# Patient Record
Sex: Female | Born: 1940 | Race: Black or African American | Hispanic: No | Marital: Single | Smoking: Former smoker
Health system: Southern US, Community
[De-identification: ages and names within clinical notes are randomized; demographics above are authoritative.]

## PROBLEM LIST (undated history)

## (undated) DIAGNOSIS — M549 Dorsalgia, unspecified: Secondary | ICD-10-CM

---

## 2010-07-07 ENCOUNTER — Other Ambulatory Visit: Payer: Self-pay | Admitting: Orthopedic Surgery

## 2010-07-07 DIAGNOSIS — M545 Low back pain, unspecified: Secondary | ICD-10-CM

## 2010-07-08 ENCOUNTER — Ambulatory Visit
Admission: RE | Admit: 2010-07-08 | Discharge: 2010-07-08 | Disposition: A | Discharge status: Home / Self Care | Payer: Medicare Other | Source: Ambulatory Visit | Attending: Orthopedic Surgery | Admitting: Orthopedic Surgery

## 2010-07-08 ENCOUNTER — Ambulatory Visit
Admission: RE | Admit: 2010-07-08 | Discharge: 2010-07-08 | Disposition: A | Discharge status: Home / Self Care | Payer: PRIVATE HEALTH INSURANCE | Source: Ambulatory Visit | Attending: Orthopedic Surgery | Admitting: Orthopedic Surgery

## 2010-07-08 DIAGNOSIS — M545 Low back pain, unspecified: Secondary | ICD-10-CM

## 2010-07-19 ENCOUNTER — Other Ambulatory Visit (HOSPITAL_COMMUNITY): Payer: Self-pay | Admitting: Orthopedic Surgery

## 2010-07-19 DIAGNOSIS — M79604 Pain in right leg: Secondary | ICD-10-CM

## 2010-07-21 ENCOUNTER — Ambulatory Visit (HOSPITAL_COMMUNITY)
Admission: RE | Admit: 2010-07-21 | Discharge: 2010-07-21 | Disposition: A | Discharge status: Home / Self Care | Payer: Medicare Other | Source: Ambulatory Visit | Attending: Orthopedic Surgery | Admitting: Orthopedic Surgery

## 2010-07-21 ENCOUNTER — Other Ambulatory Visit (HOSPITAL_COMMUNITY): Payer: Self-pay | Admitting: Orthopedic Surgery

## 2010-07-21 DIAGNOSIS — IMO0002 Reserved for concepts with insufficient information to code with codable children: Secondary | ICD-10-CM

## 2010-07-21 DIAGNOSIS — M795 Residual foreign body in soft tissue: Secondary | ICD-10-CM | POA: Insufficient documentation

## 2010-07-21 DIAGNOSIS — Z1810 Retained metal fragments, unspecified: Secondary | ICD-10-CM | POA: Insufficient documentation

## 2010-07-21 DIAGNOSIS — M79604 Pain in right leg: Secondary | ICD-10-CM

## 2010-10-17 ENCOUNTER — Emergency Department (HOSPITAL_COMMUNITY)
Admission: EM | Admit: 2010-10-17 | Discharge: 2010-10-17 | Disposition: A | Discharge status: Home / Self Care | Payer: Medicare Other | Attending: Emergency Medicine | Admitting: Emergency Medicine

## 2010-10-17 ENCOUNTER — Emergency Department (HOSPITAL_COMMUNITY): Payer: Medicare Other

## 2010-10-17 DIAGNOSIS — F3289 Other specified depressive episodes: Secondary | ICD-10-CM | POA: Insufficient documentation

## 2010-10-17 DIAGNOSIS — M545 Low back pain, unspecified: Secondary | ICD-10-CM | POA: Insufficient documentation

## 2010-10-17 DIAGNOSIS — R5381 Other malaise: Secondary | ICD-10-CM | POA: Insufficient documentation

## 2010-10-17 DIAGNOSIS — I10 Essential (primary) hypertension: Secondary | ICD-10-CM | POA: Insufficient documentation

## 2010-10-17 DIAGNOSIS — Z79899 Other long term (current) drug therapy: Secondary | ICD-10-CM | POA: Insufficient documentation

## 2010-10-17 DIAGNOSIS — R51 Headache: Secondary | ICD-10-CM | POA: Insufficient documentation

## 2010-10-17 DIAGNOSIS — R5383 Other fatigue: Secondary | ICD-10-CM | POA: Insufficient documentation

## 2010-10-17 DIAGNOSIS — G8929 Other chronic pain: Secondary | ICD-10-CM | POA: Insufficient documentation

## 2010-10-17 DIAGNOSIS — F329 Major depressive disorder, single episode, unspecified: Secondary | ICD-10-CM | POA: Insufficient documentation

## 2010-10-17 LAB — URINALYSIS, ROUTINE W REFLEX MICROSCOPIC
Glucose, UA: NEGATIVE mg/dL
Hgb urine dipstick: NEGATIVE
Specific Gravity, Urine: 1.016 (ref 1.005–1.030)
pH: 5.5 (ref 5.0–8.0)

## 2010-10-17 LAB — CBC
HCT: 43.1 % (ref 36.0–46.0)
Hemoglobin: 14.9 g/dL (ref 12.0–15.0)
MCHC: 34.6 g/dL (ref 30.0–36.0)
RBC: 5.31 MIL/uL — ABNORMAL HIGH (ref 3.87–5.11)
WBC: 10 10*3/uL (ref 4.0–10.5)

## 2010-10-17 LAB — COMPREHENSIVE METABOLIC PANEL
ALT: 11 U/L (ref 0–35)
Albumin: 3.4 g/dL — ABNORMAL LOW (ref 3.5–5.2)
Alkaline Phosphatase: 75 U/L (ref 39–117)
Calcium: 9.4 mg/dL (ref 8.4–10.5)
GFR calc Af Amer: 82 mL/min — ABNORMAL LOW (ref >90)
Glucose, Bld: 92 mg/dL (ref 70–99)
Potassium: 3.4 mEq/L — ABNORMAL LOW (ref 3.5–5.1)
Sodium: 137 mEq/L (ref 135–145)
Total Protein: 6.6 g/dL (ref 6.0–8.3)

## 2010-10-17 LAB — SEDIMENTATION RATE: Sed Rate: 9 mm/hr (ref 0–22)

## 2010-10-17 LAB — URINE MICROSCOPIC-ADD ON

## 2010-10-18 LAB — URINE CULTURE
Colony Count: 80000
Culture  Setup Time: 201210080222

## 2011-07-07 ENCOUNTER — Emergency Department (HOSPITAL_COMMUNITY)
Admission: EM | Admit: 2011-07-07 | Discharge: 2011-07-07 | Disposition: A | Discharge status: Home / Self Care | Payer: Medicare Other | Attending: Emergency Medicine | Admitting: Emergency Medicine

## 2011-07-07 ENCOUNTER — Encounter (HOSPITAL_COMMUNITY): Payer: Self-pay

## 2011-07-07 DIAGNOSIS — Z91041 Radiographic dye allergy status: Secondary | ICD-10-CM | POA: Insufficient documentation

## 2011-07-07 DIAGNOSIS — M545 Low back pain, unspecified: Secondary | ICD-10-CM | POA: Insufficient documentation

## 2011-07-07 DIAGNOSIS — M549 Dorsalgia, unspecified: Secondary | ICD-10-CM

## 2011-07-07 DIAGNOSIS — Z79899 Other long term (current) drug therapy: Secondary | ICD-10-CM | POA: Insufficient documentation

## 2011-07-07 MED ORDER — OXYCODONE HCL 30 MG PO TB12: 30.0000 mg | ORAL_TABLET | Freq: Two times a day (BID) | ORAL | Status: DC

## 2011-07-07 NOTE — ED Provider Notes (Signed)
History   This chart was scribed for Benny Lennert, MD by Sofie Rower. The patient was seen in room TR11C/TR11C and the patient's care was started at 3:04 PM     CSN: [id number]  Arrival date & time 07/07/11  1323   None     Chief Complaint  Patient presents with  . Back Pain    (Consider location/radiation/quality/duration/timing/severity/associated sxs/prior treatment) Patient is a 71 y.o. female presenting with back pain. The history is provided by the patient. No language interpreter was used.  Back Pain  This is a recurrent problem. The problem occurs constantly. The problem has not changed since onset.The pain is associated with no known injury. The pain does not radiate. The pain is moderate. The pain is the same all the time. Pertinent negatives include no chest pain, no headaches and no abdominal pain. Treatments tried: Taking oxycodone. The treatment provided moderate relief.    Pt informs the EDP that she had an appointment to go to the hospital on 06/21/11. Pt has a hx of back surgery (2010). Pt has a hx of visiting with Dr. Jordan Likes. Pt has a hx of taking oxycodone for back pain (30 mg X 4 per day). The last time the pt took oxycodone was this morning. The pt informs the EDP that the hospital told her to follow up with a PCP.  Pt does not have a PCP.    History  Substance Use Topics  . Smoking status: Not on file  . Smokeless tobacco: Not on file  . Alcohol Use: Not on file    OB History    Grav Para Term Preterm Abortions TAB SAB Ect Mult Living                  Review of Systems  Constitutional: Negative for fatigue.  HENT: Negative for congestion, sinus pressure and ear discharge.   Eyes: Negative for discharge.  Respiratory: Negative for cough.   Cardiovascular: Negative for chest pain.  Gastrointestinal: Negative for abdominal pain and diarrhea.  Genitourinary: Negative for frequency and hematuria.  Musculoskeletal: Positive for back pain.  Skin:  Negative for rash.  Neurological: Negative for seizures and headaches.  Hematological: Negative.   Psychiatric/Behavioral: Negative for hallucinations.    Allergies  Iodinated diagnostic agents  Home Medications   Current Outpatient Rx  Name Route Sig Dispense Refill  . VITAMIN D 2000 UNITS PO TABS Oral Take 2,000 Units by mouth daily.    Marland Kitchen HYDROCHLOROTHIAZIDE 12.5 MG PO CAPS Oral Take 12.5 mg by mouth daily.    . OXYCODONE HCL ER 30 MG PO TB12 Oral Take 30 mg by mouth every 12 (twelve) hours.    Marland Kitchen ROSUVASTATIN CALCIUM 5 MG PO TABS Oral Take 5 mg by mouth at bedtime.      BP 132/77  Pulse 86  Temp 98 F (36.7 C) (Oral)  Resp 16  SpO2 99%  Physical Exam  Nursing note and vitals reviewed. Constitutional: She is oriented to person, place, and time. She appears well-developed.  HENT:  Head: Normocephalic.  Eyes: Conjunctivae are normal.  Neck: No tracheal deviation present.  Cardiovascular:  No murmur heard. Musculoskeletal: Normal range of motion. She exhibits tenderness (Tenderness located at the Lumbar spine with scar over the lumbar spine. ).  Neurological: She is oriented to person, place, and time.  Skin: Skin is warm.  Psychiatric: She has a normal mood and affect.    ED Course  Procedures (including critical care time)  DIAGNOSTIC  STUDIES: Oxygen Saturation is 99% on room air, normal by my interpretation.    COORDINATION OF CARE:  3:09PM- EDP at bedside discusses treatment plan concerning pain management and follow up with PCP, Nurse Practitioner.   Labs Reviewed - No data to display No results found.   No diagnosis found.    MDM       The chart was scribed for me under my direct supervision.  I personally performed the history, physical, and medical decision making and all procedures in the evaluation of this patient.Benny Lennert, MD 07/07/11 [phone]

## 2011-07-07 NOTE — Discharge Instructions (Signed)
Follow up with your primary provider and [facility]

## 2011-07-07 NOTE — ED Notes (Signed)
sts dr Jordan Likes has a hold on the pharmacy to not fill any medications.

## 2011-07-07 NOTE — ED Notes (Signed)
Pt had surgery in 2010 and has been on oxycodone for back pain, pt here from [place] and is assigned a pain management doctor who does not give pain pills had a tens unit placed and sts that he said there is nothing else he could do for her.

## 2011-08-18 ENCOUNTER — Emergency Department (HOSPITAL_COMMUNITY)
Admission: EM | Admit: 2011-08-18 | Discharge: 2011-08-18 | Disposition: A | Discharge status: Home / Self Care | Payer: Medicare Other | Attending: Emergency Medicine | Admitting: Emergency Medicine

## 2011-08-18 DIAGNOSIS — G8929 Other chronic pain: Secondary | ICD-10-CM | POA: Insufficient documentation

## 2011-08-18 DIAGNOSIS — M549 Dorsalgia, unspecified: Secondary | ICD-10-CM | POA: Insufficient documentation

## 2011-08-18 MED ORDER — OXYCODONE HCL 30 MG PO TB12: 30.0000 mg | ORAL_TABLET | Freq: Two times a day (BID) | ORAL | Status: AC

## 2011-08-18 NOTE — ED Notes (Signed)
Pt reports hx of chronic back pain, reports she took her last "pain pill" today at 1300 and does not have a prescription for any more. Pt sent here from [facility] for a refill of her prescription pain medication. Pt is trying to get into pain management clinic.

## 2011-08-18 NOTE — ED Provider Notes (Signed)
History  Scribed for Angela Kras, MD, the patient was seen in room TR09C/TR09C. This chart was scribed by Candelaria Stagers. The patient's care started at 3:43 PM   CSN: [id number]  Arrival date & time 08/18/11  1451   First MD Initiated Contact with Patient 08/18/11 1541      Chief Complaint  Patient presents with  . Back Pain     The history is provided by the patient.   Angela Bautista is a 71 y.o. female who presents to the Emergency Department complaining of chronic back pain and need for a refill of pain medications.  Pt reports that she was sent here from Urgent Care.  She states that she ran out of pain medication earlier today.   Patient was going to a pain management clinic. She was discharged from that practice. Patient states her primary doctor will not refill her pain medications. She is in the process of trying to find another pain management clinic.  She has been living in [place] for at least a year.  No past medical history on file.  No past surgical history on file.  No family history on file.  History  Substance Use Topics  . Smoking status: Not on file  . Smokeless tobacco: Not on file  . Alcohol Use: Not on file    OB History    Grav Para Term Preterm Abortions TAB SAB Ect Mult Living                  Review of Systems  Constitutional: Negative for fever.       10 Systems reviewed and are negative for acute change except as noted in the HPI.  HENT: Negative for congestion.   Eyes: Negative for discharge and redness.  Respiratory: Negative for cough and shortness of breath.   Cardiovascular: Negative for chest pain.  Gastrointestinal: Negative for vomiting and abdominal pain.  Musculoskeletal: Positive for back pain.  Skin: Negative for rash.  Neurological: Negative for syncope, numbness and headaches.  Psychiatric/Behavioral:       No behavior change.    Allergies  Iodinated diagnostic agents  Home Medications   Current Outpatient Rx    Name Route Sig Dispense Refill  . VITAMIN D 2000 UNITS PO TABS Oral Take 2,000 Units by mouth daily.    Marland Kitchen HYDROCHLOROTHIAZIDE 12.5 MG PO CAPS Oral Take 12.5 mg by mouth daily.    . OXYCODONE HCL ER 30 MG PO TB12 Oral Take 30 mg by mouth every 12 (twelve) hours.    Marland Kitchen ROSUVASTATIN CALCIUM 5 MG PO TABS Oral Take 5 mg by mouth at bedtime.    Marland Kitchen ZOLPIDEM TARTRATE ER 12.5 MG PO TBCR Oral Take 12.5 mg by mouth at bedtime as needed. For insomnia.      BP 127/75  Pulse 83  Temp 98.5 F (36.9 C) (Oral)  Resp 18  SpO2 96%  Physical Exam  Nursing note and vitals reviewed. Constitutional: She appears well-developed and well-nourished. No distress.  HENT:  Head: Normocephalic and atraumatic.  Right Ear: External ear normal.  Left Ear: External ear normal.  Eyes: Conjunctivae are normal. Right eye exhibits no discharge. Left eye exhibits no discharge. No scleral icterus.  Neck: Neck supple. No tracheal deviation present.  Cardiovascular: Normal rate.   Pulmonary/Chest: Effort normal. No stridor. No respiratory distress.  Musculoskeletal: She exhibits no edema.  Neurological: She is alert. Cranial nerve deficit: no gross deficits.  Skin: Skin is warm and dry. No  rash noted.  Psychiatric: She has a normal mood and affect.    ED Course  Procedures   DIAGNOSTIC STUDIES: Oxygen Saturation is 96% on room air, normal by my interpretation.    COORDINATION OF CARE:     Labs Reviewed - No data to display No results found.   No diagnosis found.    MDM  I explained to the patient that refills of pain medications or been stung by a primary care doctor pain management Dr. Reesa Chew do not routinely do that in the emergency room. I will provide her prescription for a few days worth to help her in the meantime. I personally performed the services described in this documentation, which was scribed in my presence.  The recorded information has been reviewed and considered.        Angela Kras,  MD 08/18/11 [phone]

## 2011-08-22 ENCOUNTER — Emergency Department (HOSPITAL_COMMUNITY)
Admission: EM | Admit: 2011-08-22 | Discharge: 2011-08-22 | Disposition: A | Discharge status: Home / Self Care | Payer: Medicare Other | Attending: Emergency Medicine | Admitting: Emergency Medicine

## 2011-08-22 ENCOUNTER — Encounter (HOSPITAL_COMMUNITY): Payer: Self-pay

## 2011-08-22 DIAGNOSIS — M549 Dorsalgia, unspecified: Secondary | ICD-10-CM | POA: Insufficient documentation

## 2011-08-22 DIAGNOSIS — G8929 Other chronic pain: Secondary | ICD-10-CM | POA: Insufficient documentation

## 2011-08-22 HISTORY — DX: Dorsalgia, unspecified: M54.9

## 2011-08-22 MED ORDER — OXYCODONE HCL 30 MG PO TB12: 30.0000 mg | ORAL_TABLET | Freq: Two times a day (BID) | ORAL | Status: DC

## 2011-08-22 NOTE — ED Notes (Signed)
Was here Thursday, hx of chronic pain since 2010 surgery, sts needs pain meds, but we will not give it to her. sts she just needs some until she leaves for [place].

## 2011-08-23 NOTE — ED Provider Notes (Signed)
History     CSN: [id number]  Arrival date & time 08/22/11  1330   First MD Initiated Contact with Patient 08/22/11 1721      No chief complaint on file.   (Consider location/radiation/quality/duration/timing/severity/associated sxs/prior treatment) HPI Angela Bautista is a 71 y.o. female who presents to the Emergency Department complaining of chronic back pain and need for a refill of pain medications. Pt reports that she was sent here from Urgent Care. She states that she ran out of pain medication earlier today. Patient was going to a pain management clinic. She was discharged from that practice. Patient states her primary doctor will not refill her pain medications. She is in the process of trying to find another pain management clinic. She has been living in [place] for at least a year. Was seen Thursday here, received rx for oxycodone 30mg , #8, which she reports she's been taking twice a day. She reports that she is planning to go back to [place] where she is from later this week and has an appt with her old PCP there next week. She is requesting enough pain medication to get to that appt.  Past Medical History  Diagnosis Date  . Back pain     No past surgical history on file.  No family history on file.  History  Substance Use Topics  . Smoking status: Never Smoker   . Smokeless tobacco: Not on file  . Alcohol Use: No    OB History    Grav Para Term Preterm Abortions TAB SAB Ect Mult Living                  Review of Systems  Constitutional: Negative for fever and chills.  Gastrointestinal: Negative for abdominal pain.  Musculoskeletal: Positive for back pain and gait problem. Negative for myalgias and joint swelling.  Skin: Negative for color change and rash.  Neurological: Negative for weakness and numbness.    Allergies  Iodinated diagnostic agents  Home Medications   Current Outpatient Rx  Name Route Sig Dispense Refill  . VITAMIN D 2000 UNITS PO  TABS Oral Take 2,000 Units by mouth daily.    Marland Kitchen HYDROCHLOROTHIAZIDE 12.5 MG PO CAPS Oral Take 12.5 mg by mouth daily.    . OXYCODONE HCL ER 30 MG PO TB12 Oral Take 1 tablet (30 mg total) by mouth every 12 (twelve) hours. 8 tablet 0  . ROSUVASTATIN CALCIUM 5 MG PO TABS Oral Take 5 mg by mouth at bedtime.    Marland Kitchen ZOLPIDEM TARTRATE ER 12.5 MG PO TBCR Oral Take 12.5 mg by mouth at bedtime as needed. For insomnia.    . OXYCODONE HCL ER 30 MG PO TB12 Oral Take 1 tablet (30 mg total) by mouth every 12 (twelve) hours. 14 tablet 0    BP 136/83  Pulse 71  Temp 98 F (36.7 C) (Oral)  Resp 18  SpO2 96%  Physical Exam  Nursing note and vitals reviewed. Constitutional: She appears well-developed and well-nourished. No distress.  HENT:  Head: Normocephalic and atraumatic.  Neck: Normal range of motion.  Cardiovascular: Normal rate.   Pulmonary/Chest: Effort normal.  Musculoskeletal: Normal range of motion.  Neurological: She is alert.       No gross deficits  Skin: Skin is warm and dry. She is not diaphoretic.  Psychiatric: She has a normal mood and affect.    ED Course  Procedures (including critical care time)  Labs Reviewed - No data to display No results  found.   1. Chronic back pain       MDM  Pt presents requesting pain med refill. States she's planning to move back to [place] later this week. Again explained that the ED is not an appropriate place for pain med refills. Small rx given. Stated that she is NOT to receive further pain meds here. She verbalized understanding of this.       Grant Fontana, PA-C 08/23/11 1752

## 2011-08-25 NOTE — ED Provider Notes (Signed)
Medical screening examination/treatment/procedure(s) were performed by non-physician practitioner and as supervising physician I was immediately available for consultation/collaboration.  Naira Standiford, MD 08/25/11 0738 

## 2012-12-19 DIAGNOSIS — G8929 Other chronic pain: Secondary | ICD-10-CM | POA: Insufficient documentation

## 2012-12-19 DIAGNOSIS — I10 Essential (primary) hypertension: Secondary | ICD-10-CM | POA: Insufficient documentation

## 2012-12-19 DIAGNOSIS — M1990 Unspecified osteoarthritis, unspecified site: Secondary | ICD-10-CM | POA: Insufficient documentation

## 2014-04-02 DIAGNOSIS — G8929 Other chronic pain: Secondary | ICD-10-CM | POA: Insufficient documentation

## 2015-12-14 DIAGNOSIS — Z981 Arthrodesis status: Secondary | ICD-10-CM | POA: Insufficient documentation

## 2017-05-16 DIAGNOSIS — E782 Mixed hyperlipidemia: Secondary | ICD-10-CM | POA: Insufficient documentation

## 2017-05-31 DIAGNOSIS — N183 Chronic kidney disease, stage 3 unspecified: Secondary | ICD-10-CM | POA: Insufficient documentation

## 2017-06-03 DIAGNOSIS — Z789 Other specified health status: Secondary | ICD-10-CM | POA: Insufficient documentation

## 2017-09-06 DIAGNOSIS — R7303 Prediabetes: Secondary | ICD-10-CM | POA: Insufficient documentation

## 2019-03-21 DIAGNOSIS — I739 Peripheral vascular disease, unspecified: Secondary | ICD-10-CM | POA: Insufficient documentation

## 2020-11-30 ENCOUNTER — Ambulatory Visit
Admission: RE | Admit: 2020-11-30 | Discharge: 2020-11-30 | Disposition: A | Discharge status: Home / Self Care | Payer: Medicare Other | Source: Ambulatory Visit | Attending: Family Medicine | Admitting: Family Medicine

## 2020-11-30 ENCOUNTER — Other Ambulatory Visit: Payer: Self-pay | Admitting: Family Medicine

## 2020-11-30 ENCOUNTER — Other Ambulatory Visit: Payer: Self-pay

## 2020-11-30 DIAGNOSIS — R52 Pain, unspecified: Secondary | ICD-10-CM

## 2020-12-02 ENCOUNTER — Other Ambulatory Visit: Payer: Self-pay | Admitting: Family Medicine

## 2020-12-02 DIAGNOSIS — Z780 Asymptomatic menopausal state: Secondary | ICD-10-CM

## 2021-04-09 ENCOUNTER — Ambulatory Visit
Admission: RE | Admit: 2021-04-09 | Discharge: 2021-04-09 | Disposition: A | Discharge status: Home / Self Care | Payer: Medicare Other | Source: Ambulatory Visit | Attending: Nurse Practitioner | Admitting: Nurse Practitioner

## 2021-04-09 ENCOUNTER — Other Ambulatory Visit: Payer: Self-pay | Admitting: Nurse Practitioner

## 2021-04-09 DIAGNOSIS — R109 Unspecified abdominal pain: Secondary | ICD-10-CM

## 2021-04-09 DIAGNOSIS — M25512 Pain in left shoulder: Secondary | ICD-10-CM

## 2021-04-15 ENCOUNTER — Other Ambulatory Visit (HOSPITAL_COMMUNITY): Payer: Self-pay | Admitting: Nurse Practitioner

## 2021-04-15 DIAGNOSIS — R109 Unspecified abdominal pain: Secondary | ICD-10-CM

## 2021-04-15 DIAGNOSIS — M25512 Pain in left shoulder: Secondary | ICD-10-CM

## 2021-04-20 ENCOUNTER — Other Ambulatory Visit: Payer: Self-pay | Admitting: Nurse Practitioner

## 2021-04-20 DIAGNOSIS — E2839 Other primary ovarian failure: Secondary | ICD-10-CM

## 2021-10-11 ENCOUNTER — Ambulatory Visit
Admission: RE | Admit: 2021-10-11 | Discharge: 2021-10-11 | Disposition: A | Discharge status: Home / Self Care | Payer: Medicare Other | Source: Ambulatory Visit | Attending: Nurse Practitioner | Admitting: Nurse Practitioner

## 2021-10-11 DIAGNOSIS — E2839 Other primary ovarian failure: Secondary | ICD-10-CM

## 2022-06-08 ENCOUNTER — Institutional Professional Consult (permissible substitution): Payer: Medicare Other | Admitting: Internal Medicine

## 2022-06-09 ENCOUNTER — Other Ambulatory Visit: Payer: Self-pay

## 2022-06-09 ENCOUNTER — Other Ambulatory Visit: Payer: Self-pay | Admitting: Nurse Practitioner

## 2022-06-09 DIAGNOSIS — S0095XA Superficial foreign body of unspecified part of head, initial encounter: Secondary | ICD-10-CM

## 2022-06-09 DIAGNOSIS — R413 Other amnesia: Secondary | ICD-10-CM

## 2022-07-19 ENCOUNTER — Inpatient Hospital Stay: Admission: RE | Admit: 2022-07-19 | Payer: Medicare Other | Source: Ambulatory Visit

## 2022-07-27 ENCOUNTER — Ambulatory Visit (INDEPENDENT_AMBULATORY_CARE_PROVIDER_SITE_OTHER): Payer: Medicare Other | Admitting: Pulmonary Disease

## 2022-07-27 ENCOUNTER — Encounter: Payer: Self-pay | Admitting: Pulmonary Disease

## 2022-07-27 VITALS — BP 126/78 | HR 83 | Ht 64.0 in | Wt 232.4 lb

## 2022-07-27 DIAGNOSIS — R0609 Other forms of dyspnea: Secondary | ICD-10-CM

## 2022-07-27 LAB — CBC WITH DIFFERENTIAL/PLATELET
Basophils Absolute: 0.1 10*3/uL (ref 0.0–0.1)
Basophils Relative: 0.9 % (ref 0.0–3.0)
Eosinophils Absolute: 0.1 10*3/uL (ref 0.0–0.7)
Eosinophils Relative: 1.3 % (ref 0.0–5.0)
HCT: 42.6 % (ref 36.0–46.0)
Hemoglobin: 13.5 g/dL (ref 12.0–15.0)
Lymphocytes Relative: 25.6 % (ref 12.0–46.0)
Lymphs Abs: 1.7 10*3/uL (ref 0.7–4.0)
MCHC: 31.7 g/dL (ref 30.0–36.0)
MCV: 85.9 fl (ref 78.0–100.0)
Monocytes Absolute: 0.4 10*3/uL (ref 0.1–1.0)
Monocytes Relative: 6.6 % (ref 3.0–12.0)
Neutro Abs: 4.4 10*3/uL (ref 1.4–7.7)
Neutrophils Relative %: 65.6 % (ref 43.0–77.0)
Platelets: 245 10*3/uL (ref 150.0–400.0)
RBC: 4.96 Mil/uL (ref 3.87–5.11)
RDW: 16.5 % — ABNORMAL HIGH (ref 11.5–15.5)
WBC: 6.7 10*3/uL (ref 4.0–10.5)

## 2022-07-27 MED ORDER — PANTOPRAZOLE SODIUM 20 MG PO TBEC: 20.0000 mg | DELAYED_RELEASE_TABLET | Freq: Two times a day (BID) | ORAL | 2 refills | Status: DC

## 2022-07-27 NOTE — Patient Instructions (Signed)
Will get some labs today including CBC with differential, IgE Start you on Protonix 20 mg twice daily Schedule PFTs and follow-up in 3 months

## 2022-07-27 NOTE — Progress Notes (Signed)
Melesa Badamo    [id number]    Aug 21, 1940  Primary Care Physician:Default, Provider, MD  Referring Physician: No referring provider defined for this encounter.  Chief complaint: Consult for dyspnea  HPI: 82 y.o. who  has a past medical history of Back pain.    Pets: Occupation: Exposures: Smoking history: Travel history: Relevant family history:   Outpatient Encounter Medications as of 07/27/2022  Medication Sig   hydrochlorothiazide (MICROZIDE) 12.5 MG capsule Take 12.5 mg by mouth daily.   oxycodone (OXYCONTIN) 30 MG TB12 Take 1 tablet (30 mg total) by mouth every 12 (twelve) hours.   rosuvastatin (CRESTOR) 5 MG tablet Take 5 mg by mouth at bedtime.   TRAZODONE HCL PO Take 1 tablet by mouth at bedtime as needed.   [DISCONTINUED] Cholecalciferol (VITAMIN D) 2000 UNITS tablet Take 2,000 Units by mouth daily.   [DISCONTINUED] oxycodone (OXYCONTIN) 30 MG TB12 Take 1 tablet (30 mg total) by mouth every 12 (twelve) hours.   [DISCONTINUED] zolpidem (AMBIEN CR) 12.5 MG CR tablet Take 12.5 mg by mouth at bedtime as needed. For insomnia.   No facility-administered encounter medications on file as of 07/27/2022.    Allergies as of 07/27/2022 - Review Complete 07/27/2022  Allergen Reaction Noted   Iodinated contrast media Hives and Itching 07/09/2010    Past Medical History:  Diagnosis Date   Back pain     No past surgical history on file.  Family History  Problem Relation Age of Onset   Pulmonary embolism Father    Asthma Brother        had as a child    Social History   Socioeconomic History   Marital status: Single    Spouse name: Not on file   Number of children: Not on file   Years of education: Not on file   Highest education level: Not on file  Occupational History   Not on file  Tobacco Use   Smoking status: Former    Current packs/day: 0.25    Average packs/day: 0.3 packs/day for 27.5 years (6.9 ttl pk-yrs)    Types: Cigarettes    Start  date: 01/11/1995    Passive exposure: Never   Smokeless tobacco: Not on file  Vaping Use   Vaping status: Never Used  Substance and Sexual Activity   Alcohol use: No   Drug use: No   Sexual activity: Not on file  Other Topics Concern   Not on file  Social History Narrative   Not on file   Social Determinants of Health   Financial Resource Strain: Not on file  Food Insecurity: Not on file  Transportation Needs: Not on file  Physical Activity: Not on file  Stress: Not on file  Social Connections: Not on file  Intimate Partner Violence: Not on file    Review of systems: Review of Systems  Constitutional: Negative for fever and chills.  HENT: Negative.   Eyes: Negative for blurred vision.  Respiratory: as per HPI  Cardiovascular: Negative for chest pain and palpitations.  Gastrointestinal: Negative for vomiting, diarrhea, blood per rectum. Genitourinary: Negative for dysuria, urgency, frequency and hematuria.  Musculoskeletal: Negative for myalgias, back pain and joint pain.  Skin: Negative for itching and rash.  Neurological: Negative for dizziness, tremors, focal weakness, seizures and loss of consciousness.  Endo/Heme/Allergies: Negative for environmental allergies.  Psychiatric/Behavioral: Negative for depression, suicidal ideas and hallucinations.  All other systems reviewed and are negative.  Physical Exam: Blood pressure  126/78, pulse 83, height 5\' 4"  (1.626 m), weight 232 lb 6.4 oz (105.4 kg), SpO2 95%. Gen:      No acute distress HEENT:  EOMI, sclera anicteric Neck:     No masses; no thyromegaly Lungs:    Clear to auscultation bilaterally; normal respiratory effort CV:         Regular rate and rhythm; no murmurs Abd:      + bowel sounds; soft, non-tender; no palpable masses, no distension Ext:    No edema; adequate peripheral perfusion Skin:      Warm and dry; no rash Neuro: alert and oriented x 3 Psych: normal mood and affect  Data  Reviewed: Imaging:  PFTs:  Labs:  Assessment:  Assessment for dyspnea  Plan/Recommendations: Will get some labs today including CBC with differential, IgE Start you on Protonix 20 mg twice daily Schedule PFTs and follow-up in 3 months   Chilton Greathouse MD [facility] 07/27/2022, 2:41 PM  CC: No ref. provider found

## 2022-07-28 LAB — IGE: IgE (Immunoglobulin E), Serum: 1187 kU/L — ABNORMAL HIGH (ref <=114)

## 2022-12-20 ENCOUNTER — Ambulatory Visit: Payer: Medicare Other | Admitting: Primary Care

## 2023-02-10 ENCOUNTER — Ambulatory Visit (INDEPENDENT_AMBULATORY_CARE_PROVIDER_SITE_OTHER): Payer: Medicare Other | Admitting: Pulmonary Disease

## 2023-02-10 DIAGNOSIS — R0609 Other forms of dyspnea: Secondary | ICD-10-CM | POA: Diagnosis not present

## 2023-02-10 LAB — PULMONARY FUNCTION TEST
DL/VA % pred: 79 %
DL/VA: 3.23 ml/min/mmHg/L
DLCO cor % pred: 62 %
DLCO cor: 11.72 ml/min/mmHg
DLCO unc % pred: 62 %
DLCO unc: 11.72 ml/min/mmHg
FEF 25-75 Post: 1.32 L/s
FEF 25-75 Pre: 0.93 L/s
FEF2575-%Change-Post: 42 %
FEF2575-%Pred-Post: 101 %
FEF2575-%Pred-Pre: 71 %
FEV1-%Change-Post: 7 %
FEV1-%Pred-Post: 79 %
FEV1-%Pred-Pre: 73 %
FEV1-Post: 1.49 L
FEV1-Pre: 1.38 L
FEV1FVC-%Change-Post: 4 %
FEV1FVC-%Pred-Pre: 99 %
FEV6-%Change-Post: 2 %
FEV6-%Pred-Post: 81 %
FEV6-%Pred-Pre: 79 %
FEV6-Post: 1.94 L
FEV6-Pre: 1.89 L
FEV6FVC-%Pred-Post: 106 %
FEV6FVC-%Pred-Pre: 106 %
FVC-%Change-Post: 2 %
FVC-%Pred-Post: 76 %
FVC-%Pred-Pre: 74 %
FVC-Post: 1.94 L
FVC-Pre: 1.89 L
Post FEV1/FVC ratio: 77 %
Post FEV6/FVC ratio: 100 %
Pre FEV1/FVC ratio: 73 %
Pre FEV6/FVC Ratio: 100 %
RV % pred: 77 %
RV: 1.89 L
TLC % pred: 82 %
TLC: 4.19 L

## 2023-02-10 NOTE — Patient Instructions (Signed)
 Full PFT performed today.

## 2023-02-10 NOTE — Progress Notes (Signed)
 Full PFT performed today.

## 2023-02-13 ENCOUNTER — Ambulatory Visit (INDEPENDENT_AMBULATORY_CARE_PROVIDER_SITE_OTHER): Payer: Medicare Other | Admitting: Primary Care

## 2023-02-13 ENCOUNTER — Encounter: Payer: Self-pay | Admitting: Primary Care

## 2023-02-13 VITALS — BP 138/76 | HR 86 | Temp 98.7°F | Ht 64.0 in | Wt 256.2 lb

## 2023-02-13 DIAGNOSIS — J4531 Mild persistent asthma with (acute) exacerbation: Secondary | ICD-10-CM | POA: Diagnosis not present

## 2023-02-13 DIAGNOSIS — R059 Cough, unspecified: Secondary | ICD-10-CM

## 2023-02-13 DIAGNOSIS — J4530 Mild persistent asthma, uncomplicated: Secondary | ICD-10-CM

## 2023-02-13 MED ORDER — PANTOPRAZOLE SODIUM 20 MG PO TBEC: 20.0000 mg | DELAYED_RELEASE_TABLET | Freq: Two times a day (BID) | ORAL | 2 refills | Status: DC

## 2023-02-13 MED ORDER — CETIRIZINE HCL 5 MG PO TABS: 5.0000 mg | ORAL_TABLET | Freq: Every day | ORAL | 3 refills | Status: AC

## 2023-02-13 MED ORDER — FLUTICASONE-SALMETEROL 115-21 MCG/ACT IN AERO: 2.0000 | INHALATION_SPRAY | Freq: Two times a day (BID) | RESPIRATORY_TRACT | 2 refills | Status: AC

## 2023-02-13 NOTE — Patient Instructions (Addendum)
-  WHEEZING: Wheezing is a high-pitched whistling sound made while breathing, often due to narrowed airways. Your wheezing has been ongoing for over a year and worsens with exertion. We will perform a fractional nitric oxide test to check for airway inflammation and start you on a daily inhaler for suspected allergic asthma. Additionally, you will begin taking an antihistamine due to your high allergy markers.  -GASTROESOPHAGEAL REFLUX DISEASE (GERD): GERD is a condition where stomach acid frequently flows back into the tube connecting your mouth and stomach, which can cause irritation. We suspect that silent reflux might be contributing to your symptoms. You will start taking Protonix to manage this, and we will reassess your symptoms after treatment to see if there is improvement.  Orders: FENO re: Cough   RX: Start Zyrtec 10 mg daily Start Advair two puffs morning and evening (rinse mouth after use)  Start Protonix twice daily    Follow-up 4-6 weeks with Dr. Isaiah Serge / if no availability ok to schedule with [facility] NP Stay on medication until follow-up

## 2023-02-13 NOTE — Progress Notes (Signed)
@Patient  ID: Angela Bautista, female    DOB: 18-Sep-1940, 83 y.o.   MRN: [id number]  No chief complaint on file.   Referring provider: No ref. provider found  HPI: 83 year old female, former light smoker.  Past medical history significant for tension, peripheral artery disease, osteoarthritis, chronic kidney disease, insomnia, hyperlipidemia, prediabetes.  02/13/2023 Discussed the use of AI scribe software for clinical note transcription with the patient, who gave verbal consent to proceed.  History of Present Illness   The patient presents with wheezing and shortness of breath on exertion. She was referred by her primary doctor for evaluation of wheezing.  She has been experiencing wheezing for approximately a year, which worsens with physical exertion such as walking. The wheezing is described as 'hard wheezing' and significantly impacts her ability to sing, an activity she previously enjoyed. Shortness of breath accompanies the wheezing, particularly with activity, and she reports occasional coughing, though it is not constant.  She has attempted to use inhalers on an as-needed basis, but these have not provided relief. She does not recall the names of the inhalers used. She has not tried any medications for acid reflux or heartburn, such as Protonix.  Allergy testing revealed significantly elevated results with an allergy marker (IgE) level of 1,187, She has not had any imaging of the chest or specific allergy treatments mentioned.      Pulmonary function testing: 02/10/23- FVC 1.94 (76%), 1.49 (79%), ratio 77   Allergies  Allergen Reactions   Iodinated Contrast Media Hives and Itching    Broke out with hives several hours after her myelogram.    Immunization History  Administered Date(s) Administered   Influenza Split 10/25/2011   Influenza,inj,quad, With Preservative 10/25/2011, 10/28/2014, 09/28/2016, 01/11/2018    Past Medical History:  Diagnosis Date   Back pain      Tobacco History: Social History   Tobacco Use  Smoking Status Former   Current packs/day: 0.25   Average packs/day: 0.3 packs/day for 28.1 years (7.0 ttl pk-yrs)   Types: Cigarettes   Start date: 01/11/1995   Passive exposure: Never  Smokeless Tobacco Not on file   Counseling given: Not Answered   Outpatient Medications Prior to Visit  Medication Sig Dispense Refill   hydrochlorothiazide (MICROZIDE) 12.5 MG capsule Take 12.5 mg by mouth daily.     oxycodone (OXYCONTIN) 30 MG TB12 Take 1 tablet (30 mg total) by mouth every 12 (twelve) hours. 8 tablet 0   pantoprazole (PROTONIX) 20 MG tablet Take 1 tablet (20 mg total) by mouth 2 (two) times daily before a meal. 60 tablet 2   rosuvastatin (CRESTOR) 5 MG tablet Take 5 mg by mouth at bedtime.     TRAZODONE HCL PO Take 1 tablet by mouth at bedtime as needed.     No facility-administered medications prior to visit.    Review of Systems  Review of Systems  Constitutional: Negative.   HENT:  Positive for postnasal drip.   Respiratory:  Positive for shortness of breath and wheezing.   Cardiovascular: Negative.    Physical Exam  There were no vitals taken for this visit. Physical Exam Constitutional:      Appearance: Normal appearance.  HENT:     Head: Normocephalic and atraumatic.     Mouth/Throat:     Mouth: Mucous membranes are moist.     Pharynx: Oropharynx is clear.  Cardiovascular:     Rate and Rhythm: Normal rate and regular rhythm.  Pulmonary:     Effort: Pulmonary effort  is normal.     Breath sounds: Wheezing present.  Musculoskeletal:        General: Normal range of motion.  Skin:    General: Skin is warm and dry.  Neurological:     General: No focal deficit present.     Mental Status: She is alert and oriented to person, place, and time. Mental status is at baseline.  Psychiatric:        Mood and Affect: Mood normal.        Behavior: Behavior normal.        Thought Content: Thought content normal.         Judgment: Judgment normal.      Lab Results:  CBC    Component Value Date/Time   WBC 6.7 07/27/2022 1508   RBC 4.96 07/27/2022 1508   HGB 13.5 07/27/2022 1508   HCT 42.6 07/27/2022 1508   PLT 245.0 07/27/2022 1508   MCV 85.9 07/27/2022 1508   MCH 28.1 10/17/2010 1213   MCHC 31.7 07/27/2022 1508   RDW 16.5 (H) 07/27/2022 1508   LYMPHSABS 1.7 07/27/2022 1508   MONOABS 0.4 07/27/2022 1508   EOSABS 0.1 07/27/2022 1508   BASOSABS 0.1 07/27/2022 1508    BMET    Component Value Date/Time   NA 137 10/17/2010 1213   K 3.4 (L) 10/17/2010 1213   CL 103 10/17/2010 1213   CO2 23 10/17/2010 1213   GLUCOSE 92 10/17/2010 1213   BUN 15 10/17/2010 1213   CREATININE 0.83 10/17/2010 1213   CALCIUM 9.4 10/17/2010 1213   GFRNONAA 70 (L) 10/17/2010 1213   GFRAA 82 (L) 10/17/2010 1213    BNP No results found for: "BNP"  ProBNP No results found for: "PROBNP"  Imaging: No results found.   Assessment & Plan:   1. Cough, unspecified type  2. Allergic asthma, mild persistent, uncomplicated (Primary)  Allergic asthma Chronic wheezing for over a year, exacerbated by exertion. Associated dyspnea symptoms and PND. Pulmonary function tests showed mild restriction without overt obstruction or BD response. However, high allergy markers suggest possible allergic asthma component. She was unable to do FENO testing. -Start Advair HFA 115-22mcg two puffs twice daily for suspected allergic asthma -Start Zyrtec 5mg  daily  -If no improvement get chest imaging   Gastroesophageal Reflux Disease (GERD) Possible silent reflux contributing to laryngeal irritation and pseudo-wheezing. Unclear if patient had previous treatment for GERD. -Resume Protonix 20mg  twice daily for suspected GERD. -Reevaluate after treatment to assess for symptom improvement and consider discontinuation if cough improves.      Glenford Bayley, NP 02/13/2023

## 2023-04-10 ENCOUNTER — Telehealth: Payer: Self-pay

## 2023-04-10 ENCOUNTER — Ambulatory Visit: Payer: Medicare Other | Admitting: Pulmonary Disease

## 2023-04-10 NOTE — Telephone Encounter (Signed)
 A letter was sent through mail that the pt's RX for Fluticasone- salmeterol (Advair) is not covered by insurance. Please advise any alternatives for this RX so we can prescribe to pt.

## 2023-04-12 ENCOUNTER — Telehealth: Payer: Self-pay

## 2023-04-12 ENCOUNTER — Other Ambulatory Visit (HOSPITAL_COMMUNITY): Payer: Self-pay

## 2023-04-12 NOTE — Telephone Encounter (Signed)
 I have the letter I will have this scanned into pt's chart so you can see it.

## 2023-04-12 NOTE — Telephone Encounter (Signed)
 Per test claims Advair HFA is still covered at this time. Do we have a copy of the letter to see if this is something that will be implemented in the future?   Generic Advair HFA - $116.30 Brand Advair HFA - [id number]  Patient also has a deductible still to meet which is contributing to the cost.

## 2023-04-12 NOTE — Telephone Encounter (Signed)
 Thank you, I don't see it in there yet- will check back periodically

## 2023-04-13 ENCOUNTER — Ambulatory Visit: Admitting: Pulmonary Disease

## 2023-04-13 NOTE — Telephone Encounter (Signed)
 Nurse has letter-pending attachment to chart

## 2023-04-28 ENCOUNTER — Emergency Department (HOSPITAL_BASED_OUTPATIENT_CLINIC_OR_DEPARTMENT_OTHER)
Admission: EM | Admit: 2023-04-28 | Discharge: 2023-04-28 | Disposition: A | Discharge status: Home / Self Care | Attending: Emergency Medicine | Admitting: Emergency Medicine

## 2023-04-28 ENCOUNTER — Other Ambulatory Visit: Payer: Self-pay

## 2023-04-28 ENCOUNTER — Encounter (HOSPITAL_BASED_OUTPATIENT_CLINIC_OR_DEPARTMENT_OTHER): Payer: Self-pay

## 2023-04-28 ENCOUNTER — Ambulatory Visit: Payer: Self-pay

## 2023-04-28 DIAGNOSIS — G894 Chronic pain syndrome: Secondary | ICD-10-CM | POA: Insufficient documentation

## 2023-04-28 DIAGNOSIS — Z7951 Long term (current) use of inhaled steroids: Secondary | ICD-10-CM | POA: Diagnosis not present

## 2023-04-28 DIAGNOSIS — J45909 Unspecified asthma, uncomplicated: Secondary | ICD-10-CM | POA: Diagnosis not present

## 2023-04-28 DIAGNOSIS — N189 Chronic kidney disease, unspecified: Secondary | ICD-10-CM | POA: Diagnosis not present

## 2023-04-28 DIAGNOSIS — G629 Polyneuropathy, unspecified: Secondary | ICD-10-CM | POA: Diagnosis not present

## 2023-04-28 DIAGNOSIS — M549 Dorsalgia, unspecified: Secondary | ICD-10-CM | POA: Diagnosis present

## 2023-04-28 LAB — BASIC METABOLIC PANEL WITH GFR
Anion gap: 10 (ref 5–15)
BUN: 27 mg/dL — ABNORMAL HIGH (ref 8–23)
CO2: 26 mmol/L (ref 22–32)
Calcium: 10.2 mg/dL (ref 8.9–10.3)
Chloride: 104 mmol/L (ref 98–111)
Creatinine, Ser: 1.45 mg/dL — ABNORMAL HIGH (ref 0.44–1.00)
GFR, Estimated: 36 mL/min — ABNORMAL LOW (ref >60)
Glucose, Bld: 55 mg/dL — ABNORMAL LOW (ref 70–99)
Potassium: 4 mmol/L (ref 3.5–5.1)
Sodium: 140 mmol/L (ref 135–145)

## 2023-04-28 MED ORDER — GABAPENTIN 300 MG PO CAPS: 300.0000 mg | ORAL_CAPSULE | Freq: Three times a day (TID) | ORAL | 1 refills | Status: DC

## 2023-04-28 MED ORDER — OXYCODONE-ACETAMINOPHEN 5-325 MG PO TABS
1.0000 | ORAL_TABLET | Freq: Once | ORAL | Status: AC
  Administered 2023-04-28: 1 via ORAL
  Filled 2023-04-28: qty 1

## 2023-04-28 MED ORDER — GABAPENTIN 100 MG PO CAPS
200.0000 mg | ORAL_CAPSULE | Freq: Once | ORAL | Status: AC
  Administered 2023-04-28: 200 mg via ORAL
  Filled 2023-04-28: qty 2

## 2023-04-28 MED ORDER — GABAPENTIN 300 MG PO CAPS: 300.0000 mg | ORAL_CAPSULE | Freq: Three times a day (TID) | ORAL | 1 refills | Status: AC

## 2023-04-28 NOTE — Discharge Instructions (Addendum)
 We are going to try gabapentin  to see if that helps with the nerve pain at night.  It is still okay to use compression socks if that makes them feel better.  Your circulation is normal today.  Your kidneys and electrolytes look good and you do not have high blood sugar at this time.

## 2023-04-28 NOTE — Telephone Encounter (Signed)
 Spoke with patient who gave permission to speak with her niece, Diane.  No triage complete as niece states that they had already spoken with pt's PCP who is [facility] and were told to go to the ED. Instructed pt and niece that the needed to go to the ED per her PCP.  Niece was asking about [facility], Instructed her that she could go to [facility].   Copied from CRM [id number]. Topic: Clinical - Red Word Triage >> Apr 28, 2023  1:43 PM Everette C wrote: [facility] that prompted transfer to Nurse Triage: The patient was seen yesterday 04/27/23 for nerve discomfort in their feet and given an injection. The patient shares with their family member that they are now experiencing significant discomfort   The patient's niece is calling on the community line Answer Assessment - Initial Assessment Questions 1. REASON FOR CALL or QUESTION: "What is your reason for calling today?" or "How can I best help you?" or "What question do you have that I can help answer?"     No triage complete.  Protocols used: Information Only Call - No Triage-A-AH

## 2023-04-28 NOTE — ED Provider Notes (Signed)
 [place] EMERGENCY DEPARTMENT AT [facility] Provider Note   CSN: [id number] Arrival date & time: 04/28/23  1508     History  Chief Complaint  Patient presents with   Back Pain    Angela Bautista is a 83 y.o. female.  Pt is an 82y/o female with hx of peripheral artery disease, osteoarthritis, chronic kidney disease, insomnia, hyperlipidemia, prediabetes, allergic asthma and GERD and chronic back pain who had been on pregabalin and OxyContin  but when she changed to the pain clinic in [place] she was switched to Suboxone which she reports did not help and she stopped taking it 2 weeks ago as well as discontinued the pregabalin because it was causing wheezing who is presenting today with c/o of her legs burning and hurting and jumping especially at night.  As well as her chronic back pain which has been worse in the last month.  The severity of her symptoms have increased with her getting off of the OxyContin  and getting on these other medications.  She currently is not taking anything for the pain and reports that it is better when she is up and moving around and always worse at night.  She reports it feels like someone sticking a hot poker in her feet and ankles but sometimes runs up her legs.  She did put on compression socks last night which did help for about an hour until the pain started again.  She has not had any swelling in her legs or weakness.  She has been able to ambulate.  She denies any fever, chest pain or shortness of breath.  She reports changing pain clinics from [facility] to [place] because it was too expensive for her to continue to pay 2 take transport over to [facility] but she reports the pain management doctor in [place] told her he did not believe she had pain and started weaning her medications and said she needed to go back to the other pain management doctor.  The history is provided by the patient.  Back Pain      Home Medications Prior to  Admission medications   Medication Sig Start Date End Date Taking? Authorizing Provider  gabapentin  (NEURONTIN ) 300 MG capsule Take 1 capsule (300 mg total) by mouth 3 (three) times daily. 04/28/23  Yes Carleta Woodrow, Benton, MD  cetirizine  (ZYRTEC ) 5 MG tablet Take 1 tablet (5 mg total) by mouth daily. 02/13/23   Hope Almarie ORN, NP  fluticasone -salmeterol (ADVAIR HFA) 115-21 MCG/ACT inhaler Inhale 2 puffs into the lungs 2 (two) times daily. 02/13/23   Hope Almarie ORN, NP  hydrochlorothiazide (MICROZIDE) 12.5 MG capsule Take 12.5 mg by mouth daily.    [provider]  oxycodone  (OXYCONTIN ) 30 MG TB12 Take 1 tablet (30 mg total) by mouth every 12 (twelve) hours. 08/18/11   Randol Simmonds, MD  pantoprazole  (PROTONIX ) 20 MG tablet Take 1 tablet (20 mg total) by mouth 2 (two) times daily before a meal. 02/13/23   Hope Almarie ORN, NP  rosuvastatin (CRESTOR) 5 MG tablet Take 5 mg by mouth at bedtime.    [provider]  TRAZODONE HCL PO Take 1 tablet by mouth at bedtime as needed.    [provider]      Allergies    Iodinated contrast media    Review of Systems   Review of Systems  Musculoskeletal:  Positive for back pain.    Physical Exam Updated Vital Signs BP (!) 152/76   Pulse 83   Temp  98.1 F (36.7 C)   Resp 16   SpO2 94%  Physical Exam Vitals and nursing note reviewed.  Constitutional:      General: She is not in acute distress.    Appearance: She is well-developed.  HENT:     Head: Normocephalic and atraumatic.  Eyes:     Conjunctiva/sclera: Conjunctivae normal.     Pupils: Pupils are equal, round, and reactive to light.  Cardiovascular:     Rate and Rhythm: Normal rate and regular rhythm.     Pulses: Normal pulses.     Heart sounds: No murmur heard.    Comments: 2+ dp and pt pulses bilaterally Pulmonary:     Effort: Pulmonary effort is normal. No respiratory distress.     Breath sounds: Normal breath sounds. No wheezing or rales.  Abdominal:      General: There is no distension.     Palpations: Abdomen is soft.     Tenderness: There is no abdominal tenderness. There is no guarding or rebound.  Musculoskeletal:        General: No tenderness. Normal range of motion.     Cervical back: Normal range of motion and neck supple.     Right lower leg: No edema.     Left lower leg: No edema.  Skin:    General: Skin is warm and dry.     Findings: No erythema or rash.  Neurological:     Mental Status: She is alert and oriented to person, place, and time.     Cranial Nerves: No dysarthria or facial asymmetry.     Sensory: Sensation is intact. No sensory deficit.     Motor: Motor function is intact. No weakness or pronator drift.     Coordination: Coordination is intact.  Psychiatric:        Behavior: Behavior normal.     ED Results / Procedures / Treatments   Labs (all labs ordered are listed, but only abnormal results are displayed) Labs Reviewed  BASIC METABOLIC PANEL WITH GFR - Abnormal; Notable for the following components:      Result Value   Glucose, Bld 55 (*)    BUN 27 (*)    Creatinine, Ser 1.45 (*)    GFR, Estimated 36 (*)    All other components within normal limits    EKG None  Radiology No results found.  Procedures Procedures    Medications Ordered in ED Medications  gabapentin  (NEURONTIN ) capsule 200 mg (200 mg Oral Given 04/28/23 1618)  oxyCODONE -acetaminophen  (PERCOCET/ROXICET) 5-325 MG per tablet 1 tablet (1 tablet Oral Given 04/28/23 1618)    ED Course/ Medical Decision Making/ A&P                                 Medical Decision Making Amount and/or Complexity of Data Reviewed Labs: ordered. Decision-making details documented in ED Course.  Risk Prescription drug management.   Patient presenting today with exacerbation of her chronic pain most likely related to recent medication changes over the last month.  Most recently patient stopped taking pregabalin because she realized that it was  causing her to wheeze.  Will try gabapentin  instead for her nerve pain.  There is no sign that this is arterial insufficiency as she has good pulses and warm feet bilaterally.  There is no sign of infection at this time.  Patient is not having symptoms concerning for an acute back issue such as  incontinence, retention, weakness in her legs.  Patient is not having any systemic symptoms at this time and low suspicion for electrolyte abnormalities or new renal issues.  Will ensure patient has a reasonable blood sugar and normal electrolytes to ensure that is not causing her symptoms based on her age and prior medical conditions.  I independently interpreted patient's BMP and blood sugar was a little bit low at 55 even though patient was asymptomatic and she was given something to eat, electrolytes were normal.  Creatinine of 1.45 without recent to compare.  In 2012 patient's creatinine was less than 1.  At this time feel that patient is stable for discharge and follow-up with PCP and her pain management.        Final Clinical Impression(s) / ED Diagnoses Final diagnoses:  Neuropathy  Chronic pain syndrome    Rx / DC Orders ED Discharge Orders          Ordered    gabapentin  (NEURONTIN ) 300 MG capsule  3 times daily        04/28/23 1805              Doretha Folks, MD 04/28/23 1806

## 2023-04-28 NOTE — ED Triage Notes (Signed)
 Pt c/o back pain, "HA that just won't go away, hereditary back issues," advises hx 3 back surgeries, oxy for pain "no help." Denies incontinence. Ambulatory to room w walker.   Denies hx sciatica.

## 2023-06-07 IMAGING — CR DG ABDOMEN 1V
2 series · 2 of 2 positions shown · non-contrast
Comparison: None.

CLINICAL DATA: Left-sided abdominal pain with hardness for 1 month.
Constipation due to medications.

EXAM:
ABDOMEN - 1 VIEW

[t abdomen supine (1 of 2)]
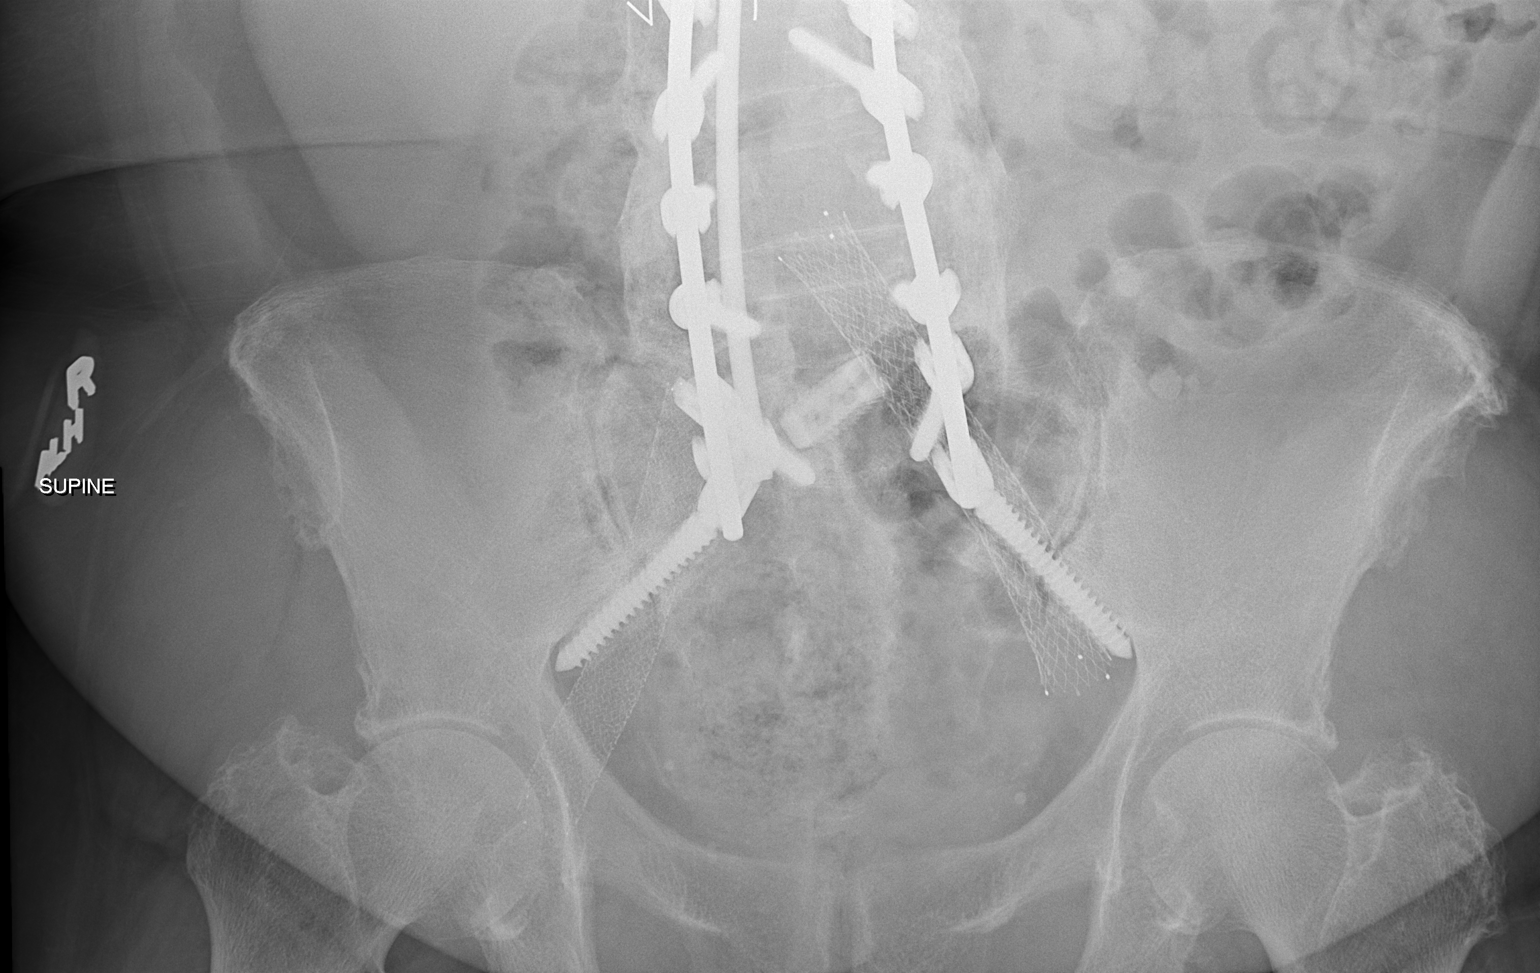

[t abdomen supine (2 of 2)]
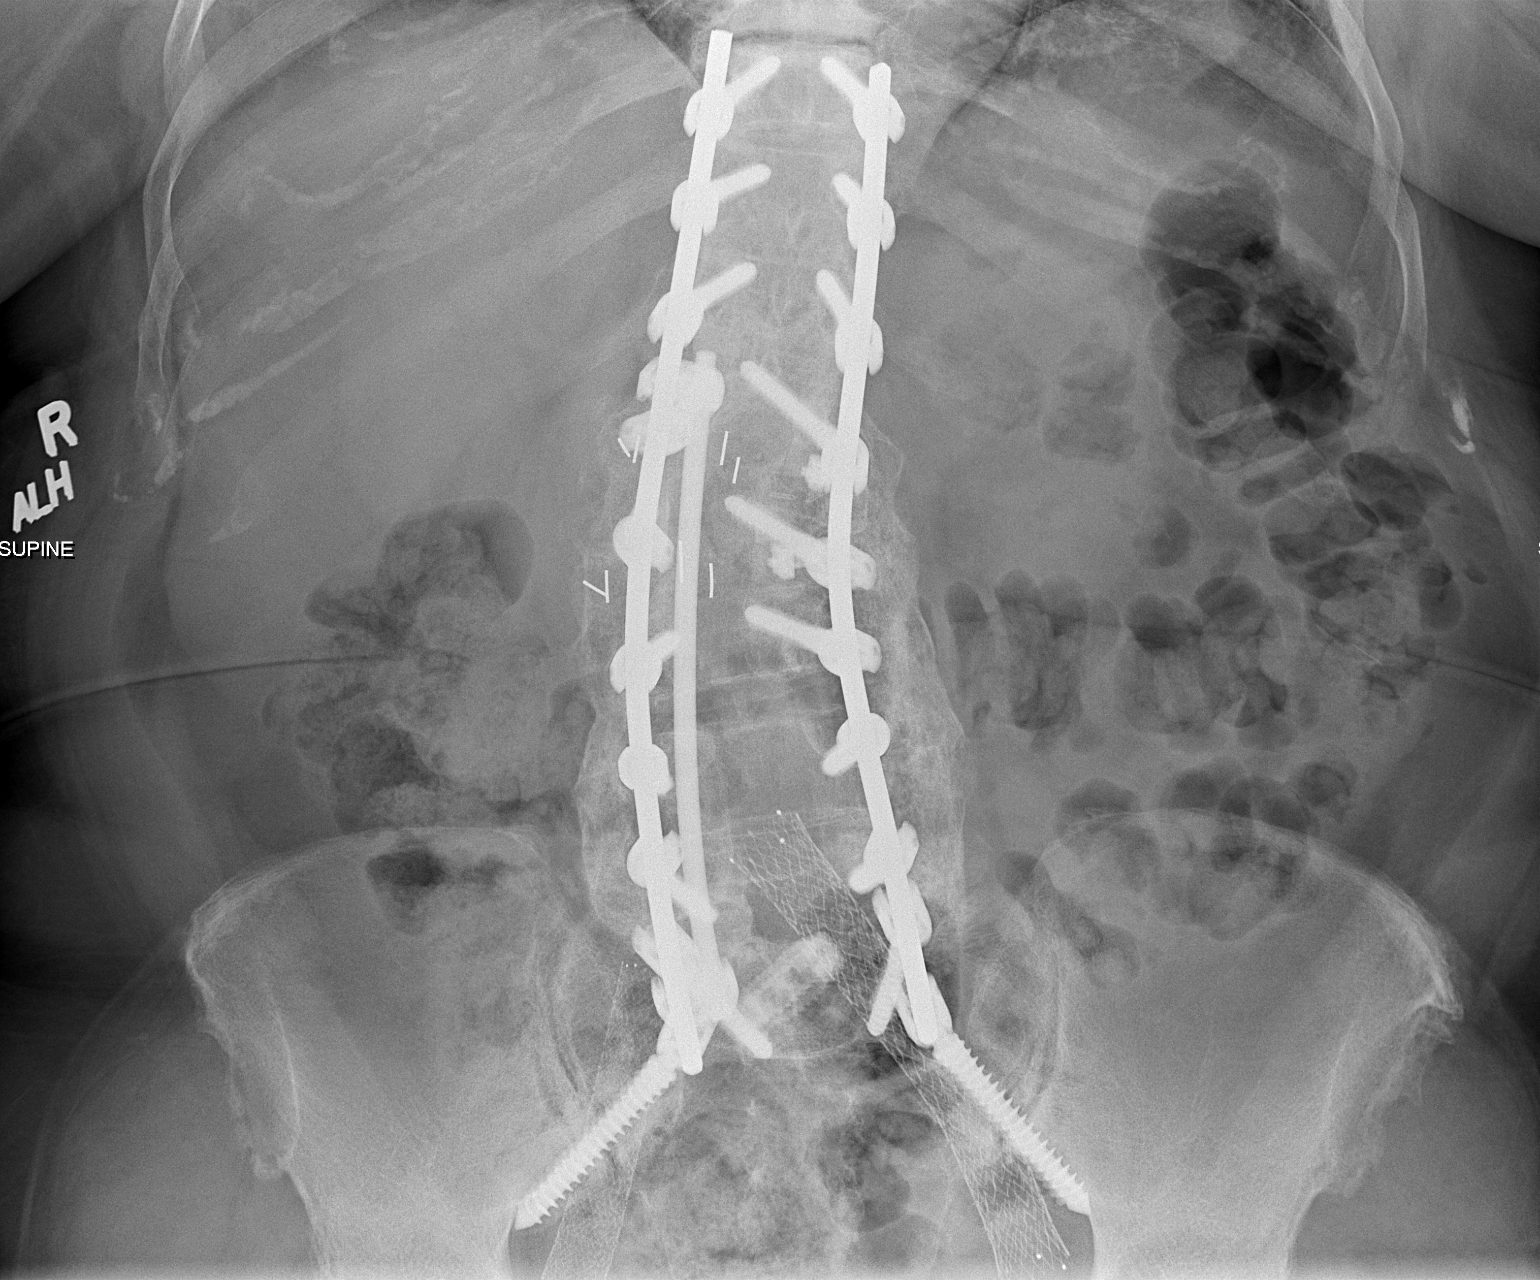

[2 of 2 positions shown; findings below may reference images not displayed]

FINDINGS: Scattered gas and stool throughout the colon with stool-filled
rectum. No small or large bowel distention. No radiopaque stones.
Visualized soft tissue contours appear intact. Postoperative changes
with posterior rod and screw fixation of the thoracolumbar sacral
spine with bone fusion. Vascular grafts in the iliac regions
bilaterally.
IMPRESSION: Normal nonobstructive bowel gas pattern with stool-filled colon.

## 2023-06-10 ENCOUNTER — Other Ambulatory Visit: Payer: Self-pay | Admitting: Primary Care

## 2023-06-19 ENCOUNTER — Ambulatory Visit: Admitting: Pulmonary Disease

## 2023-06-19 ENCOUNTER — Encounter: Payer: Self-pay | Admitting: Pulmonary Disease

## 2023-06-19 VITALS — BP 130/80 | HR 79 | Ht 64.0 in | Wt 237.6 lb

## 2023-06-19 DIAGNOSIS — R0609 Other forms of dyspnea: Secondary | ICD-10-CM

## 2023-06-19 DIAGNOSIS — R059 Cough, unspecified: Secondary | ICD-10-CM | POA: Diagnosis not present

## 2023-06-19 DIAGNOSIS — T17308A Unspecified foreign body in larynx causing other injury, initial encounter: Secondary | ICD-10-CM

## 2023-06-19 NOTE — Patient Instructions (Signed)
 VISIT SUMMARY:  Today, we discussed your ongoing issues with choking and a history of wheezing. You have experienced wheezing in the past, which resolved after stopping gabapentin . You also have significant issues with choking, especially when drinking liquids or eating, and you are currently taking Protonix  for acid reflux. We reviewed your history of smoking and exposure to secondhand smoke.  YOUR PLAN:  -DYSPHAGIA: Dysphagia means difficulty swallowing. You have been experiencing choking on liquids and solids, and difficulty breathing during these episodes. We will order a modified barium swallow evaluation to better understand the cause and refer you to speech therapy for a swallowing evaluation. Please follow up in 3 months to reassess your condition.  INSTRUCTIONS:  Please complete the modified barium swallow evaluation and attend speech therapy for a swallowing evaluation. Follow up in 3 months to reassess your dysphagia.

## 2023-06-19 NOTE — Progress Notes (Signed)
 Angela Bautista    [id number]    May 31, 1940  Primary Care Physician:Brown, Mae Schlossman, NP  Referring Physician: No referring provider defined for this encounter.  Chief complaint: Follow-up for wheezing  HPI: 83 y.o. who  has a past medical history of Back pain.  Discussed the use of AI scribe software for clinical note transcription with the patient, who gave verbal consent to proceed.  History of Present Illness Angela Bautista is an 83 year old female who presents with choking and a history of wheezing.  She has experienced wheezing for years, which she associates with the use of gabapentin  for chronic neuropathic pain. The wheezing resolved after discontinuing gabapentin  approximately six months ago. She recalls being prescribed an inhaler and Zyrtec  in the past for wheezing, but these treatments were not effective. She is currently not using any inhalers.  She describes significant issues with choking, particularly when drinking liquids or eating, requiring her to concentrate to prevent aspiration. This has been a persistent problem, causing distress, especially when alone. She also notes a change in her ability to sing, attributing it to issues with her vocal cords. She continues to take Protonix  twice daily before meals for acid reflux, which she believes may contribute to her symptoms of choking and dysphagia.  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  No feather pillows or comforters No h/o chemo/XRT/amiodarone/macrodantin/MTX  No exposure to asbestos, silica or other organic allergens  Smoking history: 7-pack-year smoker.  Quit in 1996.  Exposed to secondhand smoke Travel history: No significant travel Relevant family history: No family history of lung disease  Outpatient Encounter  Medications as of 06/19/2023  Medication Sig   cetirizine  (ZYRTEC ) 5 MG tablet Take 1 tablet (5 mg total) by mouth daily.   hydrochlorothiazide (MICROZIDE) 12.5 MG capsule Take 12.5 mg by mouth daily.   pantoprazole  (PROTONIX ) 20 MG tablet TAKE 1 TABLET BY MOUTH TWICE A DAY BEFORE A MEAL   rosuvastatin (CRESTOR) 5 MG tablet Take 5 mg by mouth at bedtime.   TRAZODONE HCL PO Take 1 tablet by mouth at bedtime as needed.   fluticasone -salmeterol (ADVAIR HFA) 115-21 MCG/ACT inhaler Inhale 2 puffs into the lungs 2 (two) times daily. (Patient not taking: Reported on 06/19/2023)   gabapentin  (NEURONTIN ) 300 MG capsule Take 1 capsule (300 mg total) by mouth 3 (three) times daily. (Patient not taking: Reported on 06/19/2023)   oxycodone  (OXYCONTIN ) 30 MG TB12 Take 1 tablet (30 mg total) by mouth every 12 (twelve) hours. (Patient not taking: Reported on 06/19/2023)   No facility-administered encounter medications on file as of 06/19/2023.   Physical Exam: Blood pressure 130/80, pulse 79, height 5\' 4"  (1.626 m), weight 237 lb 9.6 oz (107.8 kg), SpO2 94%. Gen:      No acute distress HEENT:  EOMI, sclera anicteric Neck:     No masses; no thyromegaly Lungs:    Clear to auscultation bilaterally; normal respiratory effort CV:         Regular rate and rhythm; no murmurs Abd:      + bowel sounds; soft, non-tender; no palpable masses, no distension Ext:    No edema; adequate peripheral perfusion Skin:      Warm and dry; no rash Neuro: alert and oriented x 3 Psych: normal mood and  affect  Data Reviewed: Imaging:  PFTs: 02/10/2023 FVC 1.94 [76%], FEV1 1.49 [79%], F/F77, TLC 4.18 [82%], DLCO 11.72 [62%] Moderate diffusion defect  Labs: CBC 07/27/2022-WBC 6.7, eos 1.3%, absolute eosinophil count 87.1 IgE 07/27/2022-1187  Assessment and Plan Assessment & Plan Dysphagia Dysphagia characterized by choking on liquids and solids, requiring concentration during swallowing. Symptoms include difficulty breathing during  episodes and inability to sing. Possible causes include vocal cord dysfunction or gastroesophageal reflux disease (GERD). Currently on Protonix  twice daily for GERD management. 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 Previously managed with gabapentin , which was discontinued due to suspected wheezing side effect.  Wheezing secondary to gabapentin  Wheezing suspected to be related to gabapentin  use, resolved after discontinuation six months ago. No current wheezing or use of inhalers. Elevated IgE levels previously suggested possible asthma, but symptoms have resolved.  She is currently not on any inhalers CXR today  GERD Continue PPI twice daily  Recommendations: Modified barium swallow Chest x-ray Continue PPI  Justinian Miano MD [facility] 06/19/2023, 2:19 PM  CC: No ref. provider found

## 2023-09-11 ENCOUNTER — Other Ambulatory Visit: Payer: Self-pay | Admitting: Primary Care

## 2023-11-08 ENCOUNTER — Other Ambulatory Visit (HOSPITAL_COMMUNITY): Payer: Self-pay | Admitting: Pulmonary Disease

## 2023-11-08 DIAGNOSIS — R1310 Dysphagia, unspecified: Secondary | ICD-10-CM

## 2023-11-08 DIAGNOSIS — R059 Cough, unspecified: Secondary | ICD-10-CM

## 2023-11-22 ENCOUNTER — Emergency Department (HOSPITAL_BASED_OUTPATIENT_CLINIC_OR_DEPARTMENT_OTHER)

## 2023-11-22 ENCOUNTER — Other Ambulatory Visit: Payer: Self-pay

## 2023-11-22 ENCOUNTER — Emergency Department (HOSPITAL_BASED_OUTPATIENT_CLINIC_OR_DEPARTMENT_OTHER)
Admission: EM | Admit: 2023-11-22 | Discharge: 2023-11-22 | Disposition: A | Discharge status: Home / Self Care | Source: Ambulatory Visit | Attending: Emergency Medicine | Admitting: Emergency Medicine

## 2023-11-22 DIAGNOSIS — K5900 Constipation, unspecified: Secondary | ICD-10-CM | POA: Insufficient documentation

## 2023-11-22 DIAGNOSIS — R1012 Left upper quadrant pain: Secondary | ICD-10-CM | POA: Insufficient documentation

## 2023-11-22 DIAGNOSIS — R109 Unspecified abdominal pain: Secondary | ICD-10-CM | POA: Diagnosis present

## 2023-11-22 DIAGNOSIS — R300 Dysuria: Secondary | ICD-10-CM | POA: Diagnosis not present

## 2023-11-22 DIAGNOSIS — M7918 Myalgia, other site: Secondary | ICD-10-CM

## 2023-11-22 DIAGNOSIS — R350 Frequency of micturition: Secondary | ICD-10-CM | POA: Diagnosis not present

## 2023-11-22 DIAGNOSIS — R911 Solitary pulmonary nodule: Secondary | ICD-10-CM | POA: Diagnosis not present

## 2023-11-22 LAB — COMPREHENSIVE METABOLIC PANEL WITH GFR
ALT: 14 U/L (ref 0–44)
AST: 19 U/L (ref 15–41)
Albumin: 4.2 g/dL (ref 3.5–5.0)
Alkaline Phosphatase: 82 U/L (ref 38–126)
Anion gap: 10 (ref 5–15)
BUN: 18 mg/dL (ref 8–23)
CO2: 27 mmol/L (ref 22–32)
Calcium: 9.7 mg/dL (ref 8.9–10.3)
Chloride: 103 mmol/L (ref 98–111)
Creatinine, Ser: 1.35 mg/dL — ABNORMAL HIGH (ref 0.44–1.00)
GFR, Estimated: 39 mL/min — ABNORMAL LOW (ref >60)
Glucose, Bld: 102 mg/dL — ABNORMAL HIGH (ref 70–99)
Potassium: 4.6 mmol/L (ref 3.5–5.1)
Sodium: 139 mmol/L (ref 135–145)
Total Bilirubin: 0.3 mg/dL (ref 0.0–1.2)
Total Protein: 7.3 g/dL (ref 6.5–8.1)

## 2023-11-22 LAB — CBC
HCT: 39.3 % (ref 36.0–46.0)
Hemoglobin: 12.4 g/dL (ref 12.0–15.0)
MCH: 27.9 pg (ref 26.0–34.0)
MCHC: 31.6 g/dL (ref 30.0–36.0)
MCV: 88.3 fL (ref 80.0–100.0)
Platelets: 225 K/uL (ref 150–400)
RBC: 4.45 MIL/uL (ref 3.87–5.11)
RDW: 15.4 % (ref 11.5–15.5)
WBC: 6.7 K/uL (ref 4.0–10.5)
nRBC: 0 % (ref 0.0–0.2)

## 2023-11-22 LAB — LIPASE, BLOOD: Lipase: 34 U/L (ref 11–51)

## 2023-11-22 LAB — URINALYSIS, ROUTINE W REFLEX MICROSCOPIC
Bilirubin Urine: NEGATIVE
Glucose, UA: NEGATIVE mg/dL
Hgb urine dipstick: NEGATIVE
Ketones, ur: NEGATIVE mg/dL
Leukocytes,Ua: NEGATIVE
Nitrite: NEGATIVE
Protein, ur: NEGATIVE mg/dL
Specific Gravity, Urine: 1.026 (ref 1.005–1.030)
pH: 5.5 (ref 5.0–8.0)

## 2023-11-22 MED ORDER — CYCLOBENZAPRINE HCL 5 MG PO TABS: 5.0000 mg | ORAL_TABLET | Freq: Three times a day (TID) | ORAL | 0 refills | Status: AC | PRN

## 2023-11-22 MED ORDER — DEXAMETHASONE SOD PHOSPHATE PF 10 MG/ML IJ SOLN
10.0000 mg | Freq: Once | INTRAMUSCULAR | Status: AC
  Administered 2023-11-22: 10 mg via INTRAVENOUS

## 2023-11-22 MED ORDER — OXYCODONE-ACETAMINOPHEN 5-325 MG PO TABS
1.0000 | ORAL_TABLET | Freq: Once | ORAL | Status: AC
  Administered 2023-11-22: 1 via ORAL
  Filled 2023-11-22: qty 1

## 2023-11-22 MED ORDER — IOHEXOL 350 MG/ML SOLN
80.0000 mL | Freq: Once | INTRAVENOUS | Status: AC | PRN
  Administered 2023-11-22: 80 mL via INTRAVENOUS

## 2023-11-22 NOTE — Discharge Instructions (Addendum)
 You were seen in the ER today for upper abdominal pain. A CT scan was done of your abdomen and lungs which looked great! Your pain is likely musculoskeletal. You may take your normal pain medication, along with the muscle relaxer we gave you, and would benefit from physical therapy. I would recommend you follow up with your primary care about a referral to PT. If your symptoms significantly worsen, you become short of breath, or begin having chest pain, please return to the ER.

## 2023-11-22 NOTE — ED Provider Notes (Signed)
 [place] EMERGENCY DEPARTMENT AT [facility] Provider Note   CSN: [id number] Arrival date & time: 11/22/23  1549     Patient presents with: Abdominal Pain   Angela Bautista is a 83 y.o. female who presents for further evaluation of a 2-week history of increasing abdominal pain.  She reports that she has had pain in the area for approximately a year and has been evaluated by her PCP.  She underwent a CT abdomen in July of this year which showed poor imaging due to lack of contrast and hardware.  Over the past 2 weeks however the patient has and exquisite tenderness to palpation.  She reports pain in the left upper quadrant and says it is difficult to get comfortable.  She also reports abdominal distention and the feeling that her stomach is getting harder over the past 2 weeks.  She denies any radiation to her groin.  She denies nausea, vomiting, or diarrhea.  She is chronically constipated and has had her last bowel movement today which was consistent of hard pebbles.  Additionally, she is complaining of increased dysuria and urinary frequency with the same timeframe.  She also has a history of chronic back pain however denies any new neurological symptoms at this time.  She is visibly uncomfortable during her conversation today.   Prior to Admission medications   Medication Sig Start Date End Date Taking? Authorizing Provider  cyclobenzaprine (FLEXERIL) 5 MG tablet Take 1 tablet (5 mg total) by mouth 3 (three) times daily as needed for up to 14 days for muscle spasms. You may take 2 tablets if the pain is severe. 11/22/23 12/06/23 Yes Olive Motyka, DO  cetirizine  (ZYRTEC ) 5 MG tablet Take 1 tablet (5 mg total) by mouth daily. 02/13/23   Hope Almarie ORN, NP  fluticasone -salmeterol (ADVAIR HFA) 115-21 MCG/ACT inhaler Inhale 2 puffs into the lungs 2 (two) times daily. Patient not taking: Reported on 06/19/2023 02/13/23   Hope Almarie ORN, NP  gabapentin  (NEURONTIN ) 300 MG capsule Take 1  capsule (300 mg total) by mouth 3 (three) times daily. Patient not taking: Reported on 06/19/2023 04/28/23   Doretha Folks, MD  hydrochlorothiazide (MICROZIDE) 12.5 MG capsule Take 12.5 mg by mouth daily.    [provider]  oxycodone  (OXYCONTIN ) 30 MG TB12 Take 1 tablet (30 mg total) by mouth every 12 (twelve) hours. Patient not taking: Reported on 06/19/2023 08/18/11   Randol Simmonds, MD  pantoprazole  (PROTONIX ) 20 MG tablet TAKE 1 TABLET BY MOUTH 2 TIMES A DAY BEFORE A MEAL 09/14/23   Hope Almarie ORN, NP  rosuvastatin (CRESTOR) 5 MG tablet Take 5 mg by mouth at bedtime.    [provider]  TRAZODONE HCL PO Take 1 tablet by mouth at bedtime as needed.    [provider]    Allergies: Patient has no active allergies.    Review of Systems  Constitutional:  Positive for activity change. Negative for appetite change and fever.  Respiratory:  Negative for apnea, chest tightness, shortness of breath and wheezing.   Cardiovascular:  Negative for chest pain and leg swelling.  Gastrointestinal:  Positive for abdominal distention, abdominal pain and constipation. Negative for blood in stool, diarrhea, nausea and vomiting.  Genitourinary:  Positive for dysuria, frequency and urgency. Negative for flank pain and hematuria.  Neurological:  Negative for weakness and light-headedness.    Updated Vital Signs BP (!) 142/96 (BP Location: Right Arm)   Pulse 78   Temp 98 F (36.7 C) (Oral)  Resp 16   Ht 5' 4 (1.626 m)   Wt 108.9 kg   SpO2 97%   BMI 41.20 kg/m   Physical Exam Constitutional:      General: She is not in acute distress.    Appearance: She is obese. She is not ill-appearing or toxic-appearing.  HENT:     Head: Normocephalic and atraumatic.  Cardiovascular:     Rate and Rhythm: Normal rate and regular rhythm.     Heart sounds: Normal heart sounds. No murmur heard.    No friction rub. No gallop.  Pulmonary:     Effort: Pulmonary effort is normal. No  respiratory distress.     Breath sounds: No wheezing.  Abdominal:     General: Bowel sounds are normal.     Palpations: Abdomen is soft.     Tenderness: There is abdominal tenderness in the epigastric area and left upper quadrant. There is guarding. There is no rebound.     Hernia: No hernia is present.  Neurological:     Mental Status: She is alert.     (all labs ordered are listed, but only abnormal results are displayed) Labs Reviewed  COMPREHENSIVE METABOLIC PANEL WITH GFR - Abnormal; Notable for the following components:      Result Value   Glucose, Bld 102 (*)    Creatinine, Ser 1.35 (*)    GFR, Estimated 39 (*)    All other components within normal limits  LIPASE, BLOOD  CBC  URINALYSIS, ROUTINE W REFLEX MICROSCOPIC    EKG: None  Radiology: CT ABDOMEN PELVIS W CONTRAST Result Date: 11/22/2023 CLINICAL DATA:  Left upper quadrant pain EXAM: CT ANGIOGRAPHY CHEST CT ABDOMEN AND PELVIS WITH CONTRAST TECHNIQUE: Multidetector CT imaging of the chest was performed using the standard protocol during bolus administration of intravenous contrast. Multiplanar CT image reconstructions and MIPs were obtained to evaluate the vascular anatomy. Multidetector CT imaging of the abdomen and pelvis was performed using the standard protocol during bolus administration of intravenous contrast. RADIATION DOSE REDUCTION: This exam was performed according to the departmental dose-optimization program which includes automated exposure control, adjustment of the mA and/or kV according to patient size and/or use of iterative reconstruction technique. CONTRAST:  80mL OMNIPAQUE IOHEXOL 350 MG/ML SOLN COMPARISON:  None Available. FINDINGS: CTA CHEST FINDINGS Cardiovascular: Satisfactory opacification of the pulmonary arteries to the segmental level. No evidence of pulmonary embolism. Nonaneurysmal aorta. No dissection. No significant pericardial effusion. Borderline to mild cardiomegaly Mediastinum/Nodes:  Patent trachea. No thyroid mass. No suspicious mediastinal lymph nodes. Subcentimeter right infrahilar node series 4, image 65. Esophagus within normal limits. Lungs/Pleura: No acute airspace disease, pleural effusion or pneumothorax. A few small pulmonary nodules, largest is seen at the right middle lobe and measures 5 mm on series 6, image 94. Musculoskeletal: Sternum appears intact. Advanced degenerative disc space narrowing and vacuum disc with osteophyte T6 through T10. Partially visualized spinal hardware at T10 and extending inferiorly. No acute osseous abnormality. Review of the MIP images confirms the above findings. CT ABDOMEN and PELVIS FINDINGS Hepatobiliary: No calcified gallstone. No focal hepatic abnormality or biliary dilatation. Pancreas: No inflammation.  Slightly prominent pancreatic duct. Spleen: Normal in size without focal abnormality. Adrenals/Urinary Tract: Adrenal glands are normal. Cyst in the right kidney for which no imaging follow-up is recommended. No hydronephrosis. The bladder is normal Stomach/Bowel: The stomach is within normal limits. No dilated small bowel. No acute bowel wall thickening. Negative appendix. Diverticular disease of the colon. Vascular/Lymphatic: Moderate aortic atherosclerosis. No aneurysm.  No suspicious lymph nodes. Bilateral common iliac vein stents. Reproductive: Hysterectomy.  No adnexal mass Other: Negative for ascites or free air. Small fat containing umbilical hernia Musculoskeletal: Extensive spinal instrumentation with posterior rods extending from T10 through the sacrum with bilateral sacroiliac screws. No acute osseous abnormality. Review of the MIP images confirms the above findings. IMPRESSION: 1. Negative for acute pulmonary embolus or aortic dissection. 2. No CT evidence for acute intra-abdominal or pelvic abnormality. 3. Small pulmonary nodules measuring up to 5 mm. No follow-up needed if patient is low-risk (and has no known or suspected primary  neoplasm). Non-contrast chest CT can be considered in 12 months if patient is high-risk. This recommendation follows the consensus statement: Guidelines for Management of Incidental Pulmonary Nodules Detected on CT Images: From the Fleischner Society 2017; Radiology 2017; 284:228-243. 4. Diverticular disease of the colon without acute inflammatory process. 5. Aortic atherosclerosis. Aortic Atherosclerosis (ICD10-I70.0). Electronically Signed   By: Luke Bun M.D.   On: 11/22/2023 19:58   CT Angio Chest PE W and/or Wo Contrast Result Date: 11/22/2023 CLINICAL DATA:  Left upper quadrant pain EXAM: CT ANGIOGRAPHY CHEST CT ABDOMEN AND PELVIS WITH CONTRAST TECHNIQUE: Multidetector CT imaging of the chest was performed using the standard protocol during bolus administration of intravenous contrast. Multiplanar CT image reconstructions and MIPs were obtained to evaluate the vascular anatomy. Multidetector CT imaging of the abdomen and pelvis was performed using the standard protocol during bolus administration of intravenous contrast. RADIATION DOSE REDUCTION: This exam was performed according to the departmental dose-optimization program which includes automated exposure control, adjustment of the mA and/or kV according to patient size and/or use of iterative reconstruction technique. CONTRAST:  80mL OMNIPAQUE IOHEXOL 350 MG/ML SOLN COMPARISON:  None Available. FINDINGS: CTA CHEST FINDINGS Cardiovascular: Satisfactory opacification of the pulmonary arteries to the segmental level. No evidence of pulmonary embolism. Nonaneurysmal aorta. No dissection. No significant pericardial effusion. Borderline to mild cardiomegaly Mediastinum/Nodes: Patent trachea. No thyroid mass. No suspicious mediastinal lymph nodes. Subcentimeter right infrahilar node series 4, image 65. Esophagus within normal limits. Lungs/Pleura: No acute airspace disease, pleural effusion or pneumothorax. A few small pulmonary nodules, largest is seen  at the right middle lobe and measures 5 mm on series 6, image 94. Musculoskeletal: Sternum appears intact. Advanced degenerative disc space narrowing and vacuum disc with osteophyte T6 through T10. Partially visualized spinal hardware at T10 and extending inferiorly. No acute osseous abnormality. Review of the MIP images confirms the above findings. CT ABDOMEN and PELVIS FINDINGS Hepatobiliary: No calcified gallstone. No focal hepatic abnormality or biliary dilatation. Pancreas: No inflammation.  Slightly prominent pancreatic duct. Spleen: Normal in size without focal abnormality. Adrenals/Urinary Tract: Adrenal glands are normal. Cyst in the right kidney for which no imaging follow-up is recommended. No hydronephrosis. The bladder is normal Stomach/Bowel: The stomach is within normal limits. No dilated small bowel. No acute bowel wall thickening. Negative appendix. Diverticular disease of the colon. Vascular/Lymphatic: Moderate aortic atherosclerosis. No aneurysm. No suspicious lymph nodes. Bilateral common iliac vein stents. Reproductive: Hysterectomy.  No adnexal mass Other: Negative for ascites or free air. Small fat containing umbilical hernia Musculoskeletal: Extensive spinal instrumentation with posterior rods extending from T10 through the sacrum with bilateral sacroiliac screws. No acute osseous abnormality. Review of the MIP images confirms the above findings. IMPRESSION: 1. Negative for acute pulmonary embolus or aortic dissection. 2. No CT evidence for acute intra-abdominal or pelvic abnormality. 3. Small pulmonary nodules measuring up to 5 mm. No follow-up needed if patient is  low-risk (and has no known or suspected primary neoplasm). Non-contrast chest CT can be considered in 12 months if patient is high-risk. This recommendation follows the consensus statement: Guidelines for Management of Incidental Pulmonary Nodules Detected on CT Images: From the Fleischner Society 2017; Radiology 2017;  284:228-243. 4. Diverticular disease of the colon without acute inflammatory process. 5. Aortic atherosclerosis. Aortic Atherosclerosis (ICD10-I70.0). Electronically Signed   By: Luke Bun M.D.   On: 11/22/2023 19:58      Medications Ordered in the ED  oxyCODONE -acetaminophen  (PERCOCET/ROXICET) 5-325 MG per tablet 1 tablet (1 tablet Oral Given 11/22/23 1815)  iohexol (OMNIPAQUE) 350 MG/ML injection 80 mL (80 mLs Intravenous Contrast Given 11/22/23 1923)  dexamethasone (DECADRON) injection 10 mg (10 mg Intravenous Given 11/22/23 2134)    Clinical Course as of 11/22/23 2215  Wed Nov 22, 2023  1751 Chloride: 103 [OK]  1751 Lipase: 34 [OK]  1752 Creatinine(!): 1.35 [OK]    Clinical Course User Index [OK] Myrna Bitters, DO    Abdominal pain The patient presents for evaluation of a 2-week history of increasing left upper quadrant pain.  On exam she is exquisitely tender to palpation to the epigastric region with guarding.  She is also tender to the left upper quadrant.  Of note she was hypoxic on arrival 89%.  CBC unremarkable, CMP with elevated creatinine consistent with baseline.  Lipase 34.  Due to exquisite tenderness on exam will perform CTAP with contrast and evaluate for acute intra-abdominal process.  With location of pain being left and lower ribs as well as her hypoxia station, will rule out PE with CT angio. Imaging negative for PE or aortic dissection. Small pulmonary nodules noted. Abdomen without acute findings. Labs unremarkable At this time, low suspicion for acute cardiopulmonary or abdominal process. Suspect that the left sided pain is musculoskeletal in nature. Discussed this with the patient who verbalized understanding. Instructed to follow up closely with PCP. Decadron given here for inflammation and pt discharged with flexeril as needed for pain. The patient is stable for discharge at this time.     Final diagnoses:  Musculoskeletal pain    ED Discharge Orders           Ordered    cyclobenzaprine (FLEXERIL) 5 MG tablet  3 times daily PRN        11/22/23 2126               Reise Gladney, DO 11/22/23 2215    Dreama Longs, MD 11/23/23 1538

## 2023-11-22 NOTE — ED Triage Notes (Signed)
 Pt POV reporting LUQ pain past few months, was told she may have hernia, pain worsening, advised to be seen in ED. Last BM today, denies n/v.

## 2023-11-28 ENCOUNTER — Encounter (HOSPITAL_COMMUNITY)

## 2023-11-29 ENCOUNTER — Ambulatory Visit (HOSPITAL_COMMUNITY)
Admission: RE | Admit: 2023-11-29 | Discharge: 2023-11-29 | Disposition: A | Discharge status: Home / Self Care | Source: Ambulatory Visit | Attending: Nurse Practitioner | Admitting: Nurse Practitioner

## 2023-11-29 ENCOUNTER — Ambulatory Visit (HOSPITAL_COMMUNITY): Admission: RE | Admit: 2023-11-29 | Source: Ambulatory Visit

## 2023-11-29 DIAGNOSIS — R059 Cough, unspecified: Secondary | ICD-10-CM

## 2023-11-29 DIAGNOSIS — R0609 Other forms of dyspnea: Secondary | ICD-10-CM

## 2023-11-29 DIAGNOSIS — T17308A Unspecified foreign body in larynx causing other injury, initial encounter: Secondary | ICD-10-CM

## 2023-12-14 ENCOUNTER — Ambulatory Visit: Admitting: Pulmonary Disease

## 2023-12-26 ENCOUNTER — Ambulatory Visit (HOSPITAL_COMMUNITY)

## 2023-12-26 ENCOUNTER — Ambulatory Visit (HOSPITAL_COMMUNITY)
Admission: RE | Admit: 2023-12-26 | Discharge: 2023-12-26 | Disposition: A | Source: Ambulatory Visit | Attending: Nurse Practitioner

## 2024-01-10 ENCOUNTER — Other Ambulatory Visit: Payer: Self-pay | Admitting: Primary Care

## 2024-01-16 ENCOUNTER — Encounter (HOSPITAL_COMMUNITY)

## 2024-02-05 ENCOUNTER — Encounter (HOSPITAL_COMMUNITY)

## 2024-02-27 ENCOUNTER — Encounter (HOSPITAL_COMMUNITY)
